# Patient Record
Sex: Female | Born: 1985 | Race: White | Hispanic: No | Marital: Single | Smoking: Current every day smoker
Health system: Southern US, Community
[De-identification: ages and names within clinical notes are randomized; demographics above are authoritative.]

---
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
## 2006-04-05 ENCOUNTER — Other Ambulatory Visit: Admission: RE | Admit: 2006-04-05 | Discharge: 2006-04-05 | Payer: Self-pay | Admitting: Gynecology

## 2006-05-05 ENCOUNTER — Inpatient Hospital Stay (HOSPITAL_COMMUNITY): Admission: AD | Admit: 2006-05-05 | Discharge: 2006-05-05 | Payer: Self-pay | Admitting: Gynecology

## 2006-09-11 ENCOUNTER — Encounter: Admission: RE | Admit: 2006-09-11 | Discharge: 2006-09-11 | Payer: Self-pay | Admitting: Gynecology

## 2006-10-07 ENCOUNTER — Inpatient Hospital Stay (HOSPITAL_COMMUNITY): Admission: AD | Admit: 2006-10-07 | Discharge: 2006-10-07 | Payer: Self-pay | Admitting: Gynecology

## 2006-10-09 ENCOUNTER — Inpatient Hospital Stay (HOSPITAL_COMMUNITY): Admission: AD | Admit: 2006-10-09 | Discharge: 2006-10-09 | Payer: Self-pay | Admitting: Gynecology

## 2006-10-13 ENCOUNTER — Encounter (INDEPENDENT_AMBULATORY_CARE_PROVIDER_SITE_OTHER): Payer: Self-pay | Admitting: Specialist

## 2006-10-13 ENCOUNTER — Inpatient Hospital Stay (HOSPITAL_COMMUNITY): Admission: RE | Admit: 2006-10-13 | Discharge: 2006-10-16 | Payer: Self-pay | Admitting: Family Medicine

## 2006-11-08 ENCOUNTER — Other Ambulatory Visit: Admission: RE | Admit: 2006-11-08 | Discharge: 2006-11-08 | Payer: Self-pay | Admitting: Gynecology

## 2007-02-21 HISTORY — PX: INTRAUTERINE DEVICE INSERTION: SHX323

## 2008-02-22 ENCOUNTER — Other Ambulatory Visit: Admission: RE | Admit: 2008-02-22 | Discharge: 2008-02-22 | Payer: Self-pay | Admitting: Gynecology

## 2008-07-18 ENCOUNTER — Ambulatory Visit (HOSPITAL_COMMUNITY): Admission: RE | Admit: 2008-07-18 | Discharge: 2008-07-18 | Payer: Self-pay | Admitting: General Surgery

## 2008-07-18 ENCOUNTER — Encounter (INDEPENDENT_AMBULATORY_CARE_PROVIDER_SITE_OTHER): Payer: Self-pay | Admitting: General Surgery

## 2010-11-05 ENCOUNTER — Other Ambulatory Visit: Payer: Self-pay | Admitting: Gynecology

## 2010-11-05 ENCOUNTER — Other Ambulatory Visit (HOSPITAL_COMMUNITY)
Admission: RE | Admit: 2010-11-05 | Discharge: 2010-11-05 | Disposition: A | Discharge status: Home / Self Care | Payer: Self-pay | Source: Ambulatory Visit | Attending: Gynecology | Admitting: Gynecology

## 2010-11-05 ENCOUNTER — Ambulatory Visit
Admission: RE | Admit: 2010-11-05 | Discharge: 2010-11-05 | Payer: Self-pay | Source: Home / Self Care | Attending: Gynecology | Admitting: Gynecology

## 2010-11-05 DIAGNOSIS — Z124 Encounter for screening for malignant neoplasm of cervix: Secondary | ICD-10-CM | POA: Insufficient documentation

## 2010-11-19 ENCOUNTER — Ambulatory Visit: Payer: Self-pay | Admitting: Gynecology

## 2010-11-19 DIAGNOSIS — N63 Unspecified lump in unspecified breast: Secondary | ICD-10-CM

## 2010-11-19 DIAGNOSIS — N390 Urinary tract infection, site not specified: Secondary | ICD-10-CM

## 2011-02-22 NOTE — Op Note (Signed)
NAMETASHIMA, Christine Reid               ACCOUNT NO.:  [id number]   MEDICAL RECORD NO.:  [id number]          PATIENT TYPE:  AMB   LOCATION:  DAY                           FACILITY:  [facility]   PHYSICIAN:  Tilford Pillar, MD      DATE OF BIRTH:  Dec 09, 1985   DATE OF PROCEDURE:  07/18/2008  DATE OF DISCHARGE:                               OPERATIVE REPORT   PREOPERATIVE DIAGNOSES:  Frontal scalp skin lesion and left posterior  thigh sebaceous cyst.   POSTOPERATIVE DIAGNOSIS:  Frontal scalp skin lesion and left posterior  thigh sebaceous cyst.   PROCEDURES:  1. Excision of frontal scalp skin lesion via 1 cm incision.  2. Excision of a sebaceous cyst in the posterior left thigh with a 1.5-      cm incision.   SURGEON:  Tilford Pillar, MD   ANESTHESIA:  General anesthesia via a laryngeal mask airway, local  anesthetic 0.5% Sensorcaine plain.   SPECIMEN:  Scalp skin lesion and sebaceous cyst.   ESTIMATED BLOOD LOSS:  Minimal.   INDICATIONS:  This patient is a 25 year old female who presented to my  office with a history of a cyst on her left posterior thigh, which was  present for several months.  She also noted a small skin lesion on her  right forehead.  This had been relatively stable in size and  occasionally bleeds when scraped or traumatized.  She did state she did  scrap it off in the past and then it had returned after bleeding.  It  appears similar to a hemangioma.  The risks, benefits, and alternatives  of excision of both these lesions were discussed at length.  The patient  including, but not limited to risk of bleeding, infection, and  recurrence.  The questions and concerns were addressed.  The patient was  consented for the planned procedure.   OPERATION:  The patient was taken to the operating room and was placed  in the supine position on the operating table.  She did lie initially on  a beanbag in a supine position.  At this point, she was given the  general anesthetic  and a laryngeal mask airway was placed.  Her forehead  was then prepped with the Betadine solution.  Sterile drapes were  placed.  An eye drape was utilized to provide a sterile barrier in this  area over the small lesion.  An elliptical horizontal incision was  created around the small skin lesion, total length was 1 cm.  The  hemostasis was obtained using electrocautery and the skin edges were  excised in total and was placed in the back table and was sent as a  permanent specimen to pathology.  With hemostasis obtained, a 4-0  Monocryl was utilized to reapproximate the skin edges in a running  subcuticular suture.  The skin was washed and dried with moist and dry  towel.  Benzoin was applied.  Half-inch Steri-Strips were placed.  Drapes were removed and the patient was repositioned.   The patient was, at this point, positioned into a right lateral  decubitus position.  The beanbag was utilized for support and at this  point the patient after ensuring proper positioning and padding, the  patient's left posterior thigh was prepped with DuraPrep solution.  Sterile drapes were then placed.  An elliptical incision was created  with the scalpel and the cyst was excised in total.  The subcuticular  tissue was dissected using electrocautery.  Hemostasis was obtained  using electrocautery.  A 3-0 Vicryl was utilized to reapproximate the  subcutaneous tissue and then a 4-0 Monocryl was utilized to  reapproximate the skin edges in a running subcuticular suture.  Skin was  washed and dried with a moist and dry towel, benzoin was applied around  the incision.  Half-inch Steri-Strips were placed.  The patient's drapes  were removed.  The patient was allowed to come out of general  anesthetic.  The patient was transferred back to regular hospital bed  and was transferred to the postanesthetic care unit in stable condition.  At the conclusion of the procedure, all instruments, sponge, and needle   counts were correct.  The patient tolerated the procedure well.       Tilford Pillar, MD  Electronically Signed     BZ/MEDQ  D:  07/18/2008  T:  07/19/2008  Job:  [id number]

## 2011-02-22 NOTE — H&P (Signed)
Christine Reid, Christine Reid               ACCOUNT NO.:  [id number]   MEDICAL RECORD NO.:  [id number]          PATIENT TYPE:  AMB   LOCATION:  DAY                           FACILITY:  [facility]   PHYSICIAN:  Tilford Pillar, MD      DATE OF BIRTH:  1985/12/05   DATE OF ADMISSION:  DATE OF DISCHARGE:  LH                              HISTORY & PHYSICAL   CHIEF COMPLAINT:  Skin lesions.   HISTORY OF PRESENT ILLNESS:  The patient is a 25 year old female that  was referred to my office for a small skin lesion on her frontal scalp,  as well as a small nodule in her left posterior thigh.  She states the  small skin lesion on her forehead developed during her pregnancy.  She  noted it slowly increased in size.  It is red in coloration and she  stated that she attempted to pick it off which subsequently resulted  in bleeding and recurrence of the nodule, and she denies any pain.  She  has had no fever or chills.  No drainages at all.  She had noted a  nodule in the left posterior side, slowly increased in size as well as  discomfort.  She is not having any discharge from this, no overlying  erythema, and again no fever or chills.   PAST MEDICAL HISTORY:  Depression.   SURGICAL HISTORY:  Appendectomy.   MEDICATIONS:  Lexapro.   ALLERGIES:  AUGMENTIN and PENICILLIN, both cause anoxia.   SOCIAL HISTORY:  No tobacco and no alcohol use.  Occupation as Psychologist, occupational.   PREGNANCIES:  One.   PERTINENT FAMILY HISTORY:  Not applicable.   REVIEW OF SYSTEMS:  CONSTITUTIONAL:  Unremarkable.  EYES:  Unremarkable.  EARS, NOSE, and THROAT:  Unremarkable.  RESPIRATORY:  Unremarkable.  CARDIOVASCULAR:  Unremarkable.  GASTROINTESTINAL:  Unremarkable.  GENITOURINARY:  Unremarkable.  MUSCULOSKELETAL:  Unremarkable.  SKIN:  As per HPI, otherwise unremarkable.  ENDOCRINE:  Unremarkable.  NEUROLOGIC:  Unremarkable.   PHYSICAL EXAMINATION:  GENERAL:  The patient is mildly obese.  She is  calm.  She is not in any acute  distress.  She is alert and oriented x3.  HEENT:  No significant gross deformities in the scalp and cranium.  She  does have a small, bulbous, fleshy, nontender, non-blanching skin nodule  on her frontal scalp just over the right eye.  Eyes, pupils are equal,  round, and reactive.  Extraocular movements are intact.  No scleral  icterus or conjunctival pallor is noted.  Oral mucosa is pink, no  occlusion.  NECK:  Trachea is midline.  No thyroid nodularity is noted.  No cervical  lymphadenopathy.  PULMONARY:  Unlabored respiration.  No wheezes.  No crackles.  She is  clear to auscultation both left and right lung fields.  CARDIOVASCULAR:  Regular rate and rhythm.  No murmurs and no gallops  were appreciated on exam today.  EXTREMITIES:  She has 2+ radial pulses and dorsalis pedis pulses  bilaterally.  ABDOMEN:  Positive bowel sounds.  Abdomen is soft and nontender to  palpation.  SKIN:  Skin  is warm and dry.  She does have a palpable, approximately 1-  cm mobile, mildly tender nodule on the left posterior thigh.  No  fluctuance is noted.   ASSESSMENT AND PLAN:  1. Hemangioma of the scalp.  2. Sebaceous cyst of the left thigh.  At this point, I did discuss      with the patient the risks, benefits, and alternatives of excision      of both lesions.  This was discussed with both the patient and the      patient's mother who was present during the interview and      examination.  I also discussed with the patient the likely      resulting scar.  Discussed with the patient in regards to her      forehead lesion.  This was a small lesion, there would be a small      scar associated with it.  I also further discussed with the patient      wound healing and explained that the scars are the inevitability of      the incision.  The patient understands and does wish to proceed.      Risks, benefits, and alternatives including bleeding, infection,      and recurrence were discussed with the  patient, and at this point,      we will proceed at the patient's earliest convenience.       Tilford Pillar, MD  Electronically Signed     BZ/MEDQ  D:  07/10/2008  T:  07/11/2008  Job:  [id number]   cc:   Corrie Mckusick, M.D.  Fax: [phone]

## 2011-02-25 NOTE — Op Note (Signed)
[facility]  Patient:    Christine Reid, Christine Reid Visit Number: [id number] MRN: [id number]          Service Type: MED Location: 3A A316 01 Attending Physician:  Dessa Phi Dictated by:   Elpidio Anis, M.D. Admit Date:  04/15/2002 Discharge Date: 04/17/2002                             Operative Report  PREOPERATIVE DIAGNOSIS:  Acute appendicitis.  POSTOPERATIVE DIAGNOSIS:  Acute appendicitis.  PROCEDURE:  Laparoscopic appendectomy.  SURGEON:  Elpidio Anis, M.D.  DESCRIPTION OF PROCEDURE:  Under general anesthesia, the patients abdomen was prepped and draped with sterile field. Supraumbilical midline incision was made. Verres needle was inserted uneventfully. Abdomen was then insufflated with 2 L of CO2. Using a Visiport guide, a 10 mm port was placed uneventfully. The laparoscope was placed and under videoscopic guidance, a midline suprapubic 5 mm port and a left lower quadrant 12 mm port were placed without difficulty. The appendix was wrapped in omentum. The tip of the appendix was identified. It was grasped with a nontraumatizing grasper. The omentum was gently teased off the appendix until the base of the appendix could be found. The base of the appendix was grasped with the grasper. The mesoappendix was dissected and was clipped with three clips and divided. The base of the appendix was then fully visualized. It was transected using a standard Endo GIA stapler. The appendix was placed in an Endo catch device and retrieved intact. There was minimal fluid in the abdomen. The pelvis was irrigated with the irrigating solution. It returned clear. The patient tolerated the procedure well. CO2 was allowed to escape from the abdomen and the ports were removed. The incisions were closed using 0 Dexon on the fascia of the larger incisions and staples on the skin. Dressings were placed. She was awakened from anesthesia uneventfully, transferred to a bed, and  taken to the Post Anesthetic Care Unit. Dictated by:   Elpidio Anis, M.D. Attending Physician:  Dessa Phi DD:  04/16/02 TD:  04/19/02 Job: [id number]

## 2011-02-25 NOTE — Discharge Summary (Signed)
   NAME:  Christine Reid, Christine Reid NO.:  [id number]   MEDICAL RECORD NO.:  [id number]                  PATIENT TYPE:   LOCATION:                                       FACILITY:   PHYSICIAN:  Dirk Dress. Katrinka Blazing, M.D.                DATE OF BIRTH:   DATE OF ADMISSION:  04/16/2002  DATE OF DISCHARGE:  04/17/2002                                 DISCHARGE SUMMARY   DISCHARGE DIAGNOSES:  Acute appendicitis.   SPECIAL PROCEDURE:  Laparoscopic appendectomy.   DISPOSITION:  The patient discharged home in stable and satisfactory  condition with plans for follow-up in the office on April 24, 2002.   DISCHARGE MEDICATIONS:  1. Mepergan Fortis one q.4h. as needed for pain.  2. Phenergan 25 mg q.4h. as needed for nausea.   SUMMARY:  A 25 year old female with history of acute onset of diffuse  abdominal pain with nausea and vomiting on the day prior to admission.  The  pain increased in intensity and settled in the right lower quadrant.  She  had emesis all day on the day before admission.  She was seen in the  emergency room with evidence of fever of 100 degrees, leukocytosis with  white count of 20,000; and CT evidence of an appendicolith and  periappendiceal inflammation.  She was admitted.  Admission temperature was  99 degrees.  Abdominal exam showed moderate tenderness in the right lower  quadrant.   HOSPITAL COURSE:  She was started on IV antibiotics which consisted of  Cleocin and Cefotan.  She was made n.p.o.  On the morning after admission  she underwent laparoscopic evaluation and found to have acute gangrenous  appendicitis.  Laparoscopic appendectomy was done uneventfully.  She did  exceptionally well.  She had no problems and was discharged home on the  morning of postoperative day #1, asymptomatic and afebrile.                                               Dirk Dress. Katrinka Blazing, M.D.    LCS/MEDQ  D:  07/07/2002  T:  07/08/2002  Job:  [id number]

## 2011-02-25 NOTE — Op Note (Signed)
Christine Reid, Christine Reid               ACCOUNT NO.:  [id number]   MEDICAL RECORD NO.:  [id number]          PATIENT TYPE:  INP   LOCATION:  9131                          FACILITY:  [facility]   PHYSICIAN:  Juan H. Lily Peer, M.D.DATE OF BIRTH:  02-12-1986   DATE OF PROCEDURE:  10/13/2006  DATE OF DISCHARGE:                               OPERATIVE REPORT   PREOPERATIVE DIAGNOSES:  1. Term intrauterine pregnancy.  2. Gestational diabetic.  3. Nonreassuring fetal heart rate tracing.   POSTOPERATIVE DIAGNOSES:  1. Term intrauterine pregnancy.  2. Gestational diabetic.  3. Nonreassuring fetal heart rate tracing.   SURGEON:  Juan H. Lily Peer, M.D.   ANESTHESIA:  Epidural.   PROCEDURE PERFORMED:  Primary lower uterine segment transverse cesarean  section.   FINDINGS:  Viable female infant, Apgars of 9 and 9 in the OP presentation.  Cord was near the neck but not a true nuchal cord.  Weight was 6 pounds  7 ounces.  Normal maternal pelvic anatomy.   INDICATIONS FOR PROCEDURE:  A 25 year old gravida 1, para 0, gestational  diabetic was admitted for induction.  Had Cervidil placed the night  before and the morning of October 13, 2006, she was started on high dose  Pitocin.  She progressed to an anterior cervical lip and started  experiencing deep variable decelerations with low fetal heart rate  baseline running in the low 100s and was taken to the operating room for  fetal stress due to recurrent fetal heart rate aberrancy on fetal heart  rate tracing.   DESCRIPTION OF PROCEDURE:  After the patient was adequately counseled,  she was taken to the operating room where she underwent redosing her  epidural catheter.  Foley catheter had already been placed.  The abdomen  was prepped and draped in the usual sterile fashion.  A Pfannenstiel  skin incision was made 2 cm above the symphysis pubis.  The incision was  carried down to the skin and subcutaneous tissue, down to the rectus  fascia whereby a  midline nick was made.  The fascia was incised in  transverse fashion.  Midline raphe was entered.  The peritoneal cavity  was entered cautiously.  Bladder flap was established.  Lower uterine  segment was incised in transverse fashion.  Clear amniotic fluid was  present.  The newborn's head was delivered.  The nasopharyngeal airway  was bulb suctioned.  The rest of the newborn was delivered.  The cord  was doubly clamped and excised.  He gave an immediate cry, was shown to  the mother and was passed off to the neonatologists who were in  attendance who gave the above mentioned parameters.  Patient was to be  pelvic cord blood donor so after arterial cord pH was obtained, cord  blood was obtained as well after the placenta was delivered from the  intrauterine cavity and passed off the operative field for pathologic  evaluation.  The pitocin was started IV as a __________ agent.  The  patient received a gram of cefoxitin for prophylaxis.  The uterus was  exteriorized.  The intrauterine cavity was swept  clear of remaining  products of conception and the uterus was closed in a double layered  fashion with 0 Vicryl suture, the first in a locking stitch manner, the  second in an imbricating manner.  Once this was completed, the visceral  peritoneum was not closed.  The pelvic cavity was copiously irrigated  with normal saline solution and the uterus was placed back in the pelvic  cavity.  After ascertaining adequate hemostasis, closure was started.  The rectus fascia was closed with a running stitch of 0 Vicryl suture,  the subcutaneous bleeders were Bovie cauterized, the skin was  reapproximated with skin clips followed by placement of Xeroform gauze  and 4x dressing.  The patient tolerated the procedure well and was  transferred to the recovery room with stable vital signs.   BLOOD LOSS:  500 mL.   URINE OUTPUT:  200 mL.   INTRAVENOUS FLUIDS:  Two liters of LR.      Juan H.  Lily Peer, M.D.  Electronically Signed     JHF/MEDQ  D:  10/13/2006  T:  10/13/2006  Job:  [id number]

## 2011-02-25 NOTE — Discharge Summary (Signed)
Christine Reid, Christine Reid               ACCOUNT NO.:  [id number]   MEDICAL RECORD NO.:  [id number]          PATIENT TYPE:  INP   LOCATION:  9131                          FACILITY:  [facility]   PHYSICIAN:  Juan H. Lily Peer, M.D.DATE OF BIRTH:  12/02/1985   DATE OF ADMISSION:  10/13/2006  DATE OF DISCHARGE:  10/16/2006                               DISCHARGE SUMMARY   DATES OF ADMISSION:  October 13, 2006, date of discharge October 16, 2006.  Total days hospitalized were three.   HISTORY AND HOSPITAL COURSE:  The patient is a 25 year old gravida 1,  para 0 who was admitted for induction at 38-1/2 weeks secondary to  gestational diabetes.  The patient was subsequently taken for primary  cesarean section by Dr. Reynaldo Minium on October 13, 2006, secondary to  failure to progress along with a non reassuring fetal heart rate  tracing.  The patient delivered a viable female infant, Apgar's of 9 and 9  with a weight of 6 pounds, 7 ounces.  Postpartum the patient did well.  Her blood type was O+.  She was rubella immune.  Hemoglobin was 9.2,  hematocrit 27.3. Her Foley catheter had good output over the previous 24  hours and was discontinued after her postoperative day.  She was started  on clear liquids and advanced to a regular diet and was on oral pain  medication and was ready to be discharged home on her third  postoperative day   FINAL DIAGNOSES:  1. Term intrauterine pregnancy.  2. Gestational diabetes, diet controlled.  3. Non reassuring fetal heart rate tracing.  4. Postpartum anemia.   PROCEDURES PERFORMED:  Primary lower uterine segment transverse cesarean  section.   DISPOSITION AND FOLLOW UP:  The patient was up and ambulating,  tolerating regular diet well, had passed flatus and was ready to be  discharged home on her third postoperative day.  Her staples were  removed and she was started on Tylox.  She was given a prescription of  Tylox to take one p.o. q.4-6h. p.r.n. pain along with  the continuation  of her prenatal vitamins and iron.  She was scheduled to follow up in  the office in 4 weeks for postpartum visit.      Juan H. Lily Peer, M.D.  Electronically Signed     JHF/MEDQ  D:  10/26/2006  T:  10/26/2006  Job:  [id number]

## 2011-07-11 LAB — BASIC METABOLIC PANEL
BUN: 7
CO2: 27
Calcium: 9.5
Chloride: 105
Creatinine, Ser: 0.85
GFR calc Af Amer: >60
GFR calc non Af Amer: >60
Glucose, Bld: 66 — ABNORMAL LOW
Potassium: 4.3
Sodium: 138

## 2011-07-11 LAB — CBC
HCT: 42.5
Hemoglobin: 14.3
MCHC: 33.7
MCV: 89.3
Platelets: 261
RBC: 4.76
RDW: 14.5
WBC: 11.9 — ABNORMAL HIGH

## 2011-07-11 LAB — DIFFERENTIAL
Eosinophils Absolute: 0.5
Eosinophils Relative: 5
Lymphocytes Relative: 24
Lymphs Abs: 2.8
Monocytes Relative: 9

## 2012-03-06 ENCOUNTER — Ambulatory Visit (INDEPENDENT_AMBULATORY_CARE_PROVIDER_SITE_OTHER): Payer: Commercial Managed Care - PPO | Admitting: Gynecology

## 2012-03-06 ENCOUNTER — Encounter: Payer: Self-pay | Admitting: Gynecology

## 2012-03-06 VITALS — BP 126/82 | Ht 60.25 in | Wt 198.0 lb

## 2012-03-06 DIAGNOSIS — Z01419 Encounter for gynecological examination (general) (routine) without abnormal findings: Secondary | ICD-10-CM

## 2012-03-06 NOTE — Patient Instructions (Signed)
Smoking Cessation This document explains the best ways for you to quit smoking and new treatments to help. It lists new medicines that can double or triple your chances of quitting and quitting for good. It also considers ways to avoid relapses and concerns you may have about quitting, including weight gain. NICOTINE: A POWERFUL ADDICTION If you have tried to quit smoking, you know how hard it can be. It is hard because nicotine is a very addictive drug. For some people, it can be as addictive as heroin or cocaine. Usually, people make 2 or 3 tries, or more, before finally being able to quit. Each time you try to quit, you can learn about what helps and what hurts. Quitting takes hard work and a lot of effort, but you can quit smoking. QUITTING SMOKING IS ONE OF THE MOST IMPORTANT THINGS YOU WILL EVER DO.  You will live longer, feel better, and live better.   The impact on your body of quitting smoking is felt almost immediately:   Within 20 minutes, blood pressure decreases. Pulse returns to its normal level.   After 8 hours, carbon monoxide levels in the blood return to normal. Oxygen level increases.   After 24 hours, chance of heart attack starts to decrease. Breath, hair, and body stop smelling like smoke.   After 48 hours, damaged nerve endings begin to recover. Sense of taste and smell improve.   After 72 hours, the body is virtually free of nicotine. Bronchial tubes relax and breathing becomes easier.   After 2 to 12 weeks, lungs can hold more air. Exercise becomes easier and circulation improves.   Quitting will reduce your risk of having a heart attack, stroke, cancer, or lung disease:   After 1 year, the risk of coronary heart disease is cut in half.   After 5 years, the risk of stroke falls to the same as a nonsmoker.   After 10 years, the risk of lung cancer is cut in half and the risk of other cancers decreases significantly.   After 15 years, the risk of coronary heart  disease drops, usually to the level of a nonsmoker.   If you are pregnant, quitting smoking will improve your chances of having a healthy baby.   The people you live with, especially your children, will be healthier.   You will have extra money to spend on things other than cigarettes.  FIVE KEYS TO QUITTING Studies have shown that these 5 steps will help you quit smoking and quit for good. You have the best chances of quitting if you use them together: 1. Get ready.  2. Get support and encouragement.  3. Learn new skills and behaviors.  4. Get medicine to reduce your nicotine addiction and use it correctly.  5. Be prepared for relapse or difficult situations. Be determined to continue trying to quit, even if you do not succeed at first.  1. GET READY  Set a quit date.   Change your environment.   Get rid of ALL cigarettes, ashtrays, matches, and lighters in your home, car, and place of work.   Do not let people smoke in your home.   Review your past attempts to quit. Think about what worked and what did not.   Once you quit, do not smoke. NOT EVEN A PUFF!  2. GET SUPPORT AND ENCOURAGEMENT Studies have shown that you have a better chance of being successful if you have help. You can get support in many ways.  Tell   your family, friends, and coworkers that you are going to quit and need their support. Ask them not to smoke around you.   Talk to your caregivers (doctor, dentist, nurse, pharmacist, psychologist, and/or smoking counselor).   Get individual, group, or telephone counseling and support. The more counseling you have, the better your chances are of quitting. Programs are available at local hospitals and health centers. Call your local health department for information about programs in your area.   Spiritual beliefs and practices may help some smokers quit.   Quit meters are small computer programs online or downloadable that keep track of quit statistics, such as amount  of "quit-time," cigarettes not smoked, and money saved.   Many smokers find one or more of the many self-help books available useful in helping them quit and stay off tobacco.  3. LEARN NEW SKILLS AND BEHAVIORS  Try to distract yourself from urges to smoke. Talk to someone, go for a walk, or occupy your time with a task.   When you first try to quit, change your routine. Take a different route to work. Drink tea instead of coffee. Eat breakfast in a different place.   Do something to reduce your stress. Take a hot bath, exercise, or read a book.   Plan something enjoyable to do every day. Reward yourself for not smoking.   Explore interactive web-based programs that specialize in helping you quit.  4. GET MEDICINE AND USE IT CORRECTLY Medicines can help you stop smoking and decrease the urge to smoke. Combining medicine with the above behavioral methods and support can quadruple your chances of successfully quitting smoking. The [place] Food and Drug Administration (FDA) has approved 7 medicines to help you quit smoking. These medicines fall into 3 categories.  Nicotine replacement therapy (delivers nicotine to your body without the negative effects and risks of smoking):   Nicotine gum: Available over-the-counter.   Nicotine lozenges: Available over-the-counter.   Nicotine inhaler: Available by prescription.   Nicotine nasal spray: Available by prescription.   Nicotine skin patches (transdermal): Available by prescription and over-the-counter.   Antidepressant medicine (helps people abstain from smoking, but how this works is unknown):   Bupropion sustained-release (SR) tablets: Available by prescription.   Nicotinic receptor partial agonist (simulates the effect of nicotine in your brain):   Varenicline tartrate tablets: Available by prescription.   Ask your caregiver for advice about which medicines to use and how to use them. Carefully read the information on the package.    Everyone who is trying to quit may benefit from using a medicine. If you are pregnant or trying to become pregnant, nursing an infant, you are under age 18, or you smoke fewer than 10 cigarettes per day, talk to your caregiver before taking any nicotine replacement medicines.   You should stop using a nicotine replacement product and call your caregiver if you experience nausea, dizziness, weakness, vomiting, fast or irregular heartbeat, mouth problems with the lozenge or gum, or redness or swelling of the skin around the patch that does not go away.   Do not use any other product containing nicotine while using a nicotine replacement product.   Talk to your caregiver before using these products if you have diabetes, heart disease, asthma, stomach ulcers, you had a recent heart attack, you have high blood pressure that is not controlled with medicine, a history of irregular heartbeat, or you have been prescribed medicine to help you quit smoking.  5. BE PREPARED FOR RELAPSE OR   DIFFICULT SITUATIONS  Most relapses occur within the first 3 months after quitting. Do not be discouraged if you start smoking again. Remember, most people try several times before they finally quit.   You may have symptoms of withdrawal because your body is used to nicotine. You may crave cigarettes, be irritable, feel very hungry, cough often, get headaches, or have difficulty concentrating.   The withdrawal symptoms are only temporary. They are strongest when you first quit, but they will go away within 10 to 14 days.  Here are some difficult situations to watch for:  Alcohol. Avoid drinking alcohol. Drinking lowers your chances of successfully quitting.   Caffeine. Try to reduce the amount of caffeine you consume. It also lowers your chances of successfully quitting.   Other smokers. Being around smoking can make you want to smoke. Avoid smokers.   Weight gain. Many smokers will gain weight when they quit, usually  less than 10 pounds. Eat a healthy diet and stay active. Do not let weight gain distract you from your main goal, quitting smoking. Some medicines that help you quit smoking may also help delay weight gain. You can always lose the weight gained after you quit.   Bad mood or depression. There are a lot of ways to improve your mood other than smoking.  If you are having problems with any of these situations, talk to your caregiver. SPECIAL SITUATIONS AND CONDITIONS Studies suggest that everyone can quit smoking. Your situation or condition can give you a special reason to quit.  Pregnant women/new mothers: By quitting, you protect your baby's health and your own.   Hospitalized patients: By quitting, you reduce health problems and help healing.   Heart attack patients: By quitting, you reduce your risk of a second heart attack.   Lung, head, and neck cancer patients: By quitting, you reduce your chance of a second cancer.   Parents of children and adolescents: By quitting, you protect your children from illnesses caused by secondhand smoke.  QUESTIONS TO THINK ABOUT Think about the following questions before you try to stop smoking. You may want to talk about your answers with your caregiver.  Why do you want to quit?   If you tried to quit in the past, what helped and what did not?   What will be the most difficult situations for you after you quit? How will you plan to handle them?   Who can help you through the tough times? Your family? Friends? Caregiver?   What pleasures do you get from smoking? What ways can you still get pleasure if you quit?  Here are some questions to ask your caregiver:  How can you help me to be successful at quitting?   What medicine do you think would be best for me and how should I take it?   What should I do if I need more help?   What is smoking withdrawal like? How can I get information on withdrawal?  Quitting takes hard work and a lot of effort,  but you can quit smoking. FOR MORE INFORMATION  Smokefree.gov (http://www.smokefree.gov) provides free, accurate, evidence-based information and professional assistance to help support the immediate and long-term needs of people trying to quit smoking. Document Released: 09/20/2001 Document Revised: 09/15/2011 Document Reviewed: 07/13/2009 [employer] Patient Information 2012 [employer].                                                     Cholesterol Control Diet  Cholesterol levels in your body are determined significantly by your diet. Cholesterol levels may also be related to heart disease. The following material helps to explain this relationship and discusses what you can do to help keep your heart healthy. Not all cholesterol is bad. Low-density lipoprotein (LDL) cholesterol is the "bad" cholesterol. It may cause fatty deposits to build up inside your arteries. High-density lipoprotein (HDL) cholesterol is "good." It helps to remove the "bad" LDL cholesterol from your blood. Cholesterol is a very important risk factor for heart disease. Other risk factors are high blood pressure, smoking, stress, heredity, and weight. The heart muscle gets its supply of blood through the coronary arteries. If your LDL cholesterol is high and your HDL cholesterol is low, you are at risk for having fatty deposits build up in your coronary arteries. This leaves less room through which blood can flow. Without sufficient blood and oxygen, the heart muscle cannot function properly and you may feel chest pains (angina pectoris). When a coronary artery closes up entirely, a part of the heart muscle may die, causing a heart attack (myocardial infarction). CHECKING CHOLESTEROL When your caregiver sends your blood to a lab to be analyzed for cholesterol, a complete lipid (fat) profile may be done. With this test, the total amount of cholesterol and levels of LDL and HDL are determined. Triglycerides are a type of fat that  circulates in the blood and can also be used to determine heart disease risk. The list below describes what the numbers should be: Test: Total Cholesterol.  Less than 200 mg/dl.  Test: LDL "bad cholesterol."  Less than 100 mg/dl.   Less than 70 mg/dl if you are at very high risk of a heart attack or sudden cardiac death.  Test: HDL "good cholesterol."  Greater than 50 mg/dl for women.   Greater than 40 mg/dl for men.  Test: Triglycerides.  Less than 150 mg/dl.  CONTROLLING CHOLESTEROL WITH DIET Although exercise and lifestyle factors are important, your diet is key. That is because certain foods are known to raise cholesterol and others to lower it. The goal is to balance foods for their effect on cholesterol and more importantly, to replace saturated and trans fat with other types of fat, such as monounsaturated fat, polyunsaturated fat, and omega-3 fatty acids. On average, a person should consume no more than 15 to 17 g of saturated fat daily. Saturated and trans fats are considered "bad" fats, and they will raise LDL cholesterol. Saturated fats are primarily found in animal products such as meats, butter, and cream. However, that does not mean you need to sacrifice all your favorite foods. Today, there are good tasting, low-fat, low-cholesterol substitutes for most of the things you like to eat. Choose low-fat or nonfat alternatives. Choose round or loin cuts of red meat, since these types of cuts are lowest in fat and cholesterol. Chicken (without the skin), fish, veal, and ground turkey breast are excellent choices. Eliminate fatty meats, such as hot dogs and salami. Even shellfish have little or no saturated fat. Have a 3 oz (85 g) portion when you eat lean meat, poultry, or fish. Trans fats are also called "partially hydrogenated oils." They are oils that have been scientifically manipulated so that they are solid at room temperature resulting in a longer shelf life and improved taste and  texture of foods in which they are added. Trans fats are found in stick margarine, some tub margarines, cookies, crackers, and baked goods.    When baking and cooking, oils are an excellent substitute for butter. The monounsaturated oils are especially beneficial since it is believed they lower LDL and raise HDL. The oils you should avoid entirely are saturated tropical oils, such as coconut and palm.  Remember to eat liberally from food groups that are naturally free of saturated and trans fat, including fish, fruit, vegetables, beans, grains (barley, rice, couscous, bulgur wheat), and pasta (without cream sauces).  IDENTIFYING FOODS THAT LOWER CHOLESTEROL  Soluble fiber may lower your cholesterol. This type of fiber is found in fruits such as apples, vegetables such as broccoli, potatoes, and carrots, legumes such as beans, peas, and lentils, and grains such as barley. Foods fortified with plant sterols (phytosterol) may also lower cholesterol. You should eat at least 2 g per day of these foods for a cholesterol lowering effect.  Read package labels to identify low-saturated fats, trans fats free, and low-fat foods at the supermarket. Select cheeses that have only 2 to 3 g saturated fat per ounce. Use a heart-healthy tub margarine that is free of trans fats or partially hydrogenated oil. When buying baked goods (cookies, crackers), avoid partially hydrogenated oils. Breads and muffins should be made from whole grains (whole-wheat or whole oat flour, instead of "flour" or "enriched flour"). Buy non-creamy canned soups with reduced salt and no added fats.  FOOD PREPARATION TECHNIQUES  Never deep-fry. If you must fry, either stir-fry, which uses very little fat, or use non-stick cooking sprays. When possible, broil, bake, or roast meats, and steam vegetables. Instead of dressing vegetables with butter or margarine, use lemon and herbs, applesauce and cinnamon (for squash and sweet potatoes), nonfat yogurt,  salsa, and low-fat dressings for salads.  LOW-SATURATED FAT / LOW-FAT FOOD SUBSTITUTES Meats / Saturated Fat (g)  Avoid: Steak, marbled (3 oz/85 g) / 11 g   Choose: Steak, lean (3 oz/85 g) / 4 g   Avoid: Hamburger (3 oz/85 g) / 7 g   Choose: Hamburger, lean (3 oz/85 g) / 5 g   Avoid: Ham (3 oz/85 g) / 6 g   Choose: Ham, lean cut (3 oz/85 g) / 2.4 g   Avoid: Chicken, with skin, dark meat (3 oz/85 g) / 4 g   Choose: Chicken, skin removed, dark meat (3 oz/85 g) / 2 g   Avoid: Chicken, with skin, light meat (3 oz/85 g) / 2.5 g   Choose: Chicken, skin removed, light meat (3 oz/85 g) / 1 g  Dairy / Saturated Fat (g)  Avoid: Whole milk (1 cup) / 5 g   Choose: Low-fat milk, 2% (1 cup) / 3 g   Choose: Low-fat milk, 1% (1 cup) / 1.5 g   Choose: Skim milk (1 cup) / 0.3 g   Avoid: Hard cheese (1 oz/28 g) / 6 g   Choose: Skim milk cheese (1 oz/28 g) / 2 to 3 g   Avoid: Cottage cheese, 4% fat (1 cup) / 6.5 g   Choose: Low-fat cottage cheese, 1% fat (1 cup) / 1.5 g   Avoid: Ice cream (1 cup) / 9 g   Choose: Sherbet (1 cup) / 2.5 g   Choose: Nonfat frozen yogurt (1 cup) / 0.3 g   Choose: Frozen fruit bar / trace   Avoid: Whipped cream (1 tbs) / 3.5 g   Choose: Nondairy whipped topping (1 tbs) / 1 g  Condiments / Saturated Fat (g)  Avoid: Mayonnaise (1 tbs) / 2 g   Choose: Low-fat mayonnaise (  1 tbs) / 1 g   Avoid: Butter (1 tbs) / 7 g   Choose: Extra light margarine (1 tbs) / 1 g   Avoid: Coconut oil (1 tbs) / 11.8 g   Choose: Olive oil (1 tbs) / 1.8 g   Choose: Corn oil (1 tbs) / 1.7 g   Choose: Safflower oil (1 tbs) / 1.2 g   Choose: Sunflower oil (1 tbs) / 1.4 g   Choose: Soybean oil (1 tbs) / 2.4 g   Choose: Canola oil (1 tbs) / 1 g  Document Released: 09/26/2005 Document Revised: 06/08/2011 Document Reviewed: 03/17/2011 [employer] Patient Information 2012 [employer].  Exercise to Lose Weight Exercise and a healthy diet may help you lose weight.  Your doctor may suggest specific exercises. EXERCISE IDEAS AND TIPS  Choose low-cost things you enjoy doing, such as walking, bicycling, or exercising to workout videos.   Take stairs instead of the elevator.   Walk during your lunch break.   Park your car further away from work or school.   Go to a gym or an exercise class.   Start with 5 to 10 minutes of exercise each day. Build up to 30 minutes of exercise 4 to 6 days a week.   Wear shoes with good support and comfortable clothes.   Stretch before and after working out.   Work out until you breathe harder and your heart beats faster.   Drink extra water when you exercise.   Do not do so much that you hurt yourself, feel dizzy, or get very short of breath.  Exercises that burn about 150 calories:  Running 1  miles in 15 minutes.   Playing volleyball for 45 to 60 minutes.   Washing and waxing a car for 45 to 60 minutes.   Playing touch football for 45 minutes.   Walking 1  miles in 35 minutes.   Pushing a stroller 1  miles in 30 minutes.   Playing basketball for 30 minutes.   Raking leaves for 30 minutes.   Bicycling 5 miles in 30 minutes.   Walking 2 miles in 30 minutes.   Dancing for 30 minutes.   Shoveling snow for 15 minutes.   Swimming laps for 20 minutes.   Walking up stairs for 15 minutes.   Bicycling 4 miles in 15 minutes.   Gardening for 30 to 45 minutes.   Jumping rope for 15 minutes.   Washing windows or floors for 45 to 60 minutes.  Document Released: 10/29/2010 Document Revised: 06/08/2011 Document Reviewed: 10/29/2010 [employer] Patient Information 2012 [employer].  

## 2012-03-06 NOTE — Progress Notes (Signed)
Christine Reid Jul 17, 1986 [id number]   History:    26 y.o.  for annual gyn exam with requesting to have her IUD changed since her Mirena IUD was placed in may of 2008. Patient also bothered by persistent right axillary mass which is continuing to get bigger and tender. Patient's last gynecological examination was in 2012. She has gestational diabetes from her prior pregnancy. She stated that her work did her labs for routine physical recently and all her labs were normal. Patient states she does her monthly self breast examination.  Past medical history,surgical history, family history and social history were all reviewed and documented in the EPIC chart.  Gynecologic History Patient's last menstrual period was 03/06/2012. Contraception: IUD Last Pap: 2012. Results were: normal Last mammogram: Not indicated. Results were: Not indicated  Obstetric History OB History    Grav Para Term Preterm Abortions TAB SAB Ect Mult Living   1 1 1       1      # Outc Date GA Lbr Len/2nd Wgt Sex Del Anes PTL Lv   1 TRM     M CS  No Yes       ROS: A ROS was performed and pertinent positives and negatives are included in the history.  GENERAL: No fevers or chills. HEENT: No change in vision, no earache, sore throat or sinus congestion. NECK: No pain or stiffness. CARDIOVASCULAR: No chest pain or pressure. No palpitations. PULMONARY: No shortness of breath, cough or wheeze. GASTROINTESTINAL: No abdominal pain, nausea, vomiting or diarrhea, melena or bright red blood per rectum. GENITOURINARY: No urinary frequency, urgency, hesitancy or dysuria. MUSCULOSKELETAL: No joint or muscle pain, no back pain, no recent trauma. DERMATOLOGIC: No rash, no itching, no lesions. ENDOCRINE: No polyuria, polydipsia, no heat or cold intolerance. No recent change in weight. HEMATOLOGICAL: No anemia or easy bruising or bleeding. NEUROLOGIC: No headache, seizures, numbness, tingling or weakness. PSYCHIATRIC: No depression, no loss of  interest in normal activity or change in sleep pattern.     Exam: chaperone present  BP 126/82  Ht 5' 0.25" (1.53 m)  Wt 198 lb (89.812 kg)  BMI 38.35 kg/m2  LMP 03/06/2012  Body mass index is 38.35 kg/(m^2).  General appearance : Well developed well nourished female. No acute distress HEENT: Neck supple, trachea midline, no carotid bruits, no thyroidmegaly Lungs: Clear to auscultation, no rhonchi or wheezes, or rib retractions  Heart: Regular rate and rhythm, no murmurs or gallops Breast:Examined in sitting and supine position were symmetrical in appearance, no palpable masses or tenderness,  no skin retraction, no nipple inversion, no nipple discharge, no skin discoloration, no axillary or supraclavicular lymphadenopathy on left. Right axillary mass measuring 2-1/2 cm and tender was noted. Abdomen: no palpable masses or tenderness, no rebound or guarding Extremities: no edema or skin discoloration or tenderness  Pelvic:  Bartholin, Urethra, Skene Glands: Within normal limits             Vagina: No gross lesions or discharge  Cervix: No gross lesions or discharge, IUD string not seen  Uterus  anteverted, normal size, shape and consistency, non-tender and mobile  Adnexa  Without masses or tenderness  Anus and perineum  normal   Rectovaginal  normal sphincter tone without palpated masses or tenderness             Hemoccult not done     Assessment/Plan:  26 y.o. female for annual exam with right axillary mass measuring 2.5 x 3 cm and tender  on palpation. Nonerythematous. Patient will be referred to the general surgeon for further evaluation and possible excision. No lab work drawn today since she was done at her lab recently. Patient will return back next week to change her IUD. New screening guidelines for Pap smear discussed. No Pap smear was done today. Patient was counseled on the detrimental effects of smoking and literature formation was provided as well as on exercise and  diet.    Ok Edwards MD, 5:57 PM 03/06/2012

## 2012-03-07 ENCOUNTER — Other Ambulatory Visit: Payer: Self-pay | Admitting: *Deleted

## 2012-03-07 DIAGNOSIS — Z3049 Encounter for surveillance of other contraceptives: Secondary | ICD-10-CM

## 2012-03-07 MED ORDER — LEVONORGESTREL 20 MCG/24HR IU IUD: INTRAUTERINE_SYSTEM | Freq: Once | INTRAUTERINE | Status: AC

## 2012-03-14 ENCOUNTER — Encounter: Payer: Self-pay | Admitting: Gynecology

## 2012-03-14 ENCOUNTER — Ambulatory Visit (INDEPENDENT_AMBULATORY_CARE_PROVIDER_SITE_OTHER): Payer: Commercial Managed Care - PPO | Admitting: Gynecology

## 2012-03-14 VITALS — BP 118/80

## 2012-03-14 DIAGNOSIS — Z30433 Encounter for removal and reinsertion of intrauterine contraceptive device: Secondary | ICD-10-CM

## 2012-03-14 DIAGNOSIS — Z3049 Encounter for surveillance of other contraceptives: Secondary | ICD-10-CM

## 2012-03-14 NOTE — Progress Notes (Signed)
Patient is 26 year old was seen in the office on May 28 for her annual gynecological examination. She presented today to remove her outdated Mirena IUD. Her IUD was placed in 2008. She had a Mirena IUD and had done well with that and wanted to have it replaced with a new one. Literature information had previously been provided. The risks benefits and pros and cons were discussed. Patient fully aware this form of contraception is good for 5 years and is 99% effective. Consent form was signed.  Exam: Bartholin urethra Skene glands: Within normal limits Vagina: No lesions or discharge Cervix: No lesions or discharge IUD string not seen Uterus: Anteverted normal size shape and consistency Adnexa: No palpable masses or tenderness  Procedure note: The cervix was cleansed with Betadine solution. A single-tooth tenaculum was placed on the anterior cervical lip. A [place] clamp was used to retrieve the IUD string and IUD. The IUD was shown to the patient and discarded. The uterus was then sounded to 7 cm and a new Mirena IUD was inserted sterilely. The string was cut. Patient tolerated procedure well and will return back in one month for followup.  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

## 2012-03-14 NOTE — Patient Instructions (Addendum)
Remember to make your appointment to see the general surgeon to remove the lesion from your arm pit. We'll see them next month.Intrauterine

## 2012-04-11 ENCOUNTER — Ambulatory Visit: Payer: Commercial Managed Care - PPO | Admitting: Gynecology

## 2014-08-11 ENCOUNTER — Encounter: Payer: Self-pay | Admitting: Gynecology

## 2015-09-01 ENCOUNTER — Telehealth: Payer: Self-pay | Admitting: *Deleted

## 2015-09-01 NOTE — Telephone Encounter (Signed)
Pt called c/o spotting with Mirena IUD noticed when wiping and light bled on pad. I explained not abnormal to have spotting at time, if continues after a week or 2 or if bleeding becomes heavier to make OV for exam.

## 2017-02-22 ENCOUNTER — Encounter: Payer: Self-pay | Admitting: Gynecology
